# Patient Record
Sex: Male | Born: 1960 | Race: White | Hispanic: No | Marital: Married | Smoking: Current every day smoker
Health system: Southern US, Community
[De-identification: ages and names within clinical notes are randomized; demographics above are authoritative.]

## PROBLEM LIST (undated history)

## (undated) DIAGNOSIS — F419 Anxiety disorder, unspecified: Secondary | ICD-10-CM

## (undated) DIAGNOSIS — G4730 Sleep apnea, unspecified: Secondary | ICD-10-CM

## (undated) DIAGNOSIS — I10 Essential (primary) hypertension: Secondary | ICD-10-CM

## (undated) HISTORY — PX: CORONARY ARTERY BYPASS GRAFT: SHX141

---

## 2007-02-11 ENCOUNTER — Emergency Department (HOSPITAL_COMMUNITY): Admission: EM | Admit: 2007-02-11 | Discharge: 2007-02-11 | Payer: Self-pay | Admitting: Emergency Medicine

## 2011-03-20 ENCOUNTER — Encounter: Payer: Self-pay | Admitting: *Deleted

## 2011-03-20 ENCOUNTER — Emergency Department (INDEPENDENT_AMBULATORY_CARE_PROVIDER_SITE_OTHER): Payer: 59

## 2011-03-20 ENCOUNTER — Emergency Department (HOSPITAL_BASED_OUTPATIENT_CLINIC_OR_DEPARTMENT_OTHER)
Admission: EM | Admit: 2011-03-20 | Discharge: 2011-03-20 | Disposition: A | Discharge status: Home / Self Care | Payer: 59 | Attending: Emergency Medicine | Admitting: Emergency Medicine

## 2011-03-20 DIAGNOSIS — F411 Generalized anxiety disorder: Secondary | ICD-10-CM | POA: Insufficient documentation

## 2011-03-20 DIAGNOSIS — M2240 Chondromalacia patellae, unspecified knee: Secondary | ICD-10-CM

## 2011-03-20 DIAGNOSIS — I10 Essential (primary) hypertension: Secondary | ICD-10-CM | POA: Insufficient documentation

## 2011-03-20 DIAGNOSIS — M79606 Pain in leg, unspecified: Secondary | ICD-10-CM

## 2011-03-20 DIAGNOSIS — G4730 Sleep apnea, unspecified: Secondary | ICD-10-CM | POA: Insufficient documentation

## 2011-03-20 DIAGNOSIS — M79609 Pain in unspecified limb: Secondary | ICD-10-CM | POA: Insufficient documentation

## 2011-03-20 DIAGNOSIS — F17200 Nicotine dependence, unspecified, uncomplicated: Secondary | ICD-10-CM | POA: Insufficient documentation

## 2011-03-20 DIAGNOSIS — M1120 Other chondrocalcinosis, unspecified site: Secondary | ICD-10-CM

## 2011-03-20 HISTORY — DX: Anxiety disorder, unspecified: F41.9

## 2011-03-20 HISTORY — DX: Essential (primary) hypertension: I10

## 2011-03-20 HISTORY — DX: Sleep apnea, unspecified: G47.30

## 2011-03-20 MED ORDER — ACETAMINOPHEN 325 MG PO TABS
650.0000 mg | ORAL_TABLET | Freq: Once | ORAL | Status: DC
  Filled 2011-03-20: qty 2

## 2011-03-20 MED ORDER — CYCLOBENZAPRINE HCL 10 MG PO TABS: 10.0000 mg | ORAL_TABLET | Freq: Two times a day (BID) | ORAL | Status: AC | PRN

## 2011-03-20 MED ORDER — KETOROLAC TROMETHAMINE 60 MG/2ML IM SOLN
60.0000 mg | Freq: Once | INTRAMUSCULAR | Status: DC
  Filled 2011-03-20: qty 2

## 2011-03-20 NOTE — ED Notes (Signed)
Pt c/o bil thigh/leg pain x 1 year .

## 2011-03-20 NOTE — ED Provider Notes (Addendum)
History     CSN: [id number] Arrival date & time: 03/20/2011  1:02 PM   First MD Initiated Contact with Patient 03/20/11 1326      Chief Complaint  Patient presents with  . Leg Pain    (Consider location/radiation/quality/duration/timing/severity/associated sxs/prior treatment) HPI Legs tingling for at least a year.  Now more tingling for with standing and working for six months .  Saw Dr. At [place] and had back x-rayed for same last year.  Now increased pain in right thigh for six months.  Seen at [facility] couple months ago for the same thing. Seen about two months ago at [facility] for same and given back exercises without relief.    Past Medical History  Diagnosis Date  . Hypertension   . Anxiety   . Sleep apnea    reviewed Past Surgical History  Procedure Date  . Coronary artery bypass graft   vsd repaird  History reviewed. No pertinent family history.  History  Substance Use Topics  . Smoking status: Current Everyday Smoker -- 0.5 packs/day  . Smokeless tobacco: Not on file  . Alcohol Use: No      Review of Systems  All other systems reviewed and are negative.    Allergies  Review of patient's allergies indicates no known allergies.  Home Medications   Current Outpatient Rx  Name Route Sig Dispense Refill  . BUDESONIDE-FORMOTEROL FUMARATE 160-4.5 MCG/ACT IN AERO Inhalation Inhale 2 puffs into the lungs 2 (two) times daily.      Marland Kitchen CARVEDILOL 6.25 MG PO TABS Oral Take 6.25 mg by mouth 2 (two) times daily with a meal.      . CLONAZEPAM 1 MG PO TABS Oral Take 1 mg by mouth 2 (two) times daily as needed.      Marland Kitchen FLUTICASONE PROPIONATE 50 MCG/ACT NA SUSP Nasal Place 2 sprays into the nose daily.        BP 140/88  Pulse 58  Temp(Src) 98.1 F (36.7 C) (Oral)  Ht 5\' 11"  (1.803 m)  Wt 260 lb (117.935 kg)  BMI 36.26 kg/m2  SpO2 99%  Physical Exam  Vitals reviewed. Constitutional: He is oriented to person, place, and time. He appears well-developed  and well-nourished.  HENT:  Head: Normocephalic and atraumatic.  Eyes: Conjunctivae and EOM are normal. Pupils are equal, round, and reactive to light.  Neck: Normal range of motion. Neck supple.  Cardiovascular: Normal rate and regular rhythm.   Pulmonary/Chest: Effort normal and breath sounds normal.  Abdominal: Soft. Bowel sounds are normal.  Musculoskeletal: Normal range of motion. He exhibits no tenderness.       ls spine normal without tenderness or paraspinal tenderness full arom  Neurological: He is alert and oriented to person, place, and time. He has normal reflexes.  Skin: Skin is warm and dry.  Psychiatric: He has a normal mood and affect.    ED Course  Procedures (including critical care time)  Labs Reviewed - No data to display No results found.   No diagnosis found.    MDM   Dg Femur Right  03/20/2011  *RADIOLOGY REPORT*  Clinical Data: Right lateral femur pain.  No injury.  Bilateral knee fractures as a child.  RIGHT FEMUR - 2 VIEW  Comparison: None.  Findings: The right femur is intact.  There is radiolucency in the central patella, compatible with chondromalacia patella based on the lateral view.  The chondrocalcinosis of the medial and lateral meniscus is noted, probably post-traumatic.  The  proximal femur appears within normal limits.  Visualized pelvis is unremarkable. The soft tissues of the thigh are within normal limits.  IMPRESSION:  1.  No acute osseous abnormality. 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  Original Report Authenticated By: Andreas Newport, M.D.        Hilario Quarry, MD 03/20/11 0454  Hilario Quarry, MD 03/20/11 1512  Hilario Quarry, MD 03/21/11 [phone]

## 2013-04-16 ENCOUNTER — Emergency Department (HOSPITAL_BASED_OUTPATIENT_CLINIC_OR_DEPARTMENT_OTHER): Payer: Self-pay

## 2013-04-16 ENCOUNTER — Encounter (HOSPITAL_BASED_OUTPATIENT_CLINIC_OR_DEPARTMENT_OTHER): Payer: Self-pay | Admitting: Emergency Medicine

## 2013-04-16 ENCOUNTER — Emergency Department (HOSPITAL_BASED_OUTPATIENT_CLINIC_OR_DEPARTMENT_OTHER)
Admission: EM | Admit: 2013-04-16 | Discharge: 2013-04-16 | Disposition: A | Discharge status: Home / Self Care | Payer: Self-pay | Attending: Emergency Medicine | Admitting: Emergency Medicine

## 2013-04-16 DIAGNOSIS — IMO0002 Reserved for concepts with insufficient information to code with codable children: Secondary | ICD-10-CM | POA: Insufficient documentation

## 2013-04-16 DIAGNOSIS — F411 Generalized anxiety disorder: Secondary | ICD-10-CM | POA: Insufficient documentation

## 2013-04-16 DIAGNOSIS — R51 Headache: Secondary | ICD-10-CM | POA: Insufficient documentation

## 2013-04-16 DIAGNOSIS — Z951 Presence of aortocoronary bypass graft: Secondary | ICD-10-CM | POA: Insufficient documentation

## 2013-04-16 DIAGNOSIS — F17200 Nicotine dependence, unspecified, uncomplicated: Secondary | ICD-10-CM | POA: Insufficient documentation

## 2013-04-16 DIAGNOSIS — J209 Acute bronchitis, unspecified: Secondary | ICD-10-CM | POA: Insufficient documentation

## 2013-04-16 DIAGNOSIS — J40 Bronchitis, not specified as acute or chronic: Secondary | ICD-10-CM

## 2013-04-16 DIAGNOSIS — Z79899 Other long term (current) drug therapy: Secondary | ICD-10-CM | POA: Insufficient documentation

## 2013-04-16 DIAGNOSIS — I10 Essential (primary) hypertension: Secondary | ICD-10-CM | POA: Insufficient documentation

## 2013-04-16 LAB — CBC WITH DIFFERENTIAL/PLATELET
HCT: 41.7 % (ref 39.0–52.0)
Hemoglobin: 14.2 g/dL (ref 13.0–17.0)
Lymphs Abs: 2.6 10*3/uL (ref 0.7–4.0)
Monocytes Absolute: 0.4 10*3/uL (ref 0.1–1.0)
Monocytes Relative: 7 % (ref 3–12)
Neutro Abs: 3 10*3/uL (ref 1.7–7.7)
Neutrophils Relative %: 49 % (ref 43–77)
RBC: 4.52 MIL/uL (ref 4.22–5.81)

## 2013-04-16 LAB — BASIC METABOLIC PANEL
BUN: 19 mg/dL (ref 6–23)
CO2: 24 mEq/L (ref 19–32)
Chloride: 99 mEq/L (ref 96–112)
Creatinine, Ser: 1.1 mg/dL (ref 0.50–1.35)
Glucose, Bld: 96 mg/dL (ref 70–99)
Potassium: 3.9 mEq/L (ref 3.5–5.1)

## 2013-04-16 LAB — TROPONIN I: Troponin I: <0.3 ng/mL (ref <0.30)

## 2013-04-16 MED ORDER — HYDROCOD POLST-CHLORPHEN POLST 10-8 MG/5ML PO LQCR: 5.0000 mL | Freq: Two times a day (BID) | ORAL | Status: AC | PRN

## 2013-04-16 MED ORDER — ALBUTEROL SULFATE (5 MG/ML) 0.5% IN NEBU
5.0000 mg | INHALATION_SOLUTION | Freq: Once | RESPIRATORY_TRACT | Status: AC
  Administered 2013-04-16: 5 mg via RESPIRATORY_TRACT
  Filled 2013-04-16: qty 1

## 2013-04-16 MED ORDER — AZITHROMYCIN 250 MG PO TABS: 250.0000 mg | ORAL_TABLET | Freq: Every day | ORAL | Status: AC

## 2013-04-16 MED ORDER — IPRATROPIUM BROMIDE 0.02 % IN SOLN
0.5000 mg | Freq: Once | RESPIRATORY_TRACT | Status: AC
  Administered 2013-04-16: 0.5 mg via RESPIRATORY_TRACT
  Filled 2013-04-16: qty 2.5

## 2013-04-16 NOTE — ED Notes (Signed)
NP at bedside.

## 2013-04-16 NOTE — ED Provider Notes (Signed)
CSN: [id number]     Arrival date & time 04/16/13  1718 History   First MD Initiated Contact with Patient 04/16/13 1730     Chief Complaint  Patient presents with  . Cough  . Headache  . Chest Pain    with cough for "a while".   (Consider location/radiation/quality/duration/timing/severity/associated sxs/prior Treatment) HPI Comments: Pt c/o intermittent cp with cough and headache:subjective fever  Patient is a 52 y.o. male presenting with cough. The history is provided by the patient. No language interpreter was used.  Cough Cough characteristics:  Productive Sputum characteristics:  White Severity:  Moderate Onset quality:  Gradual Duration:  1 week Timing:  Constant Relieved by:  Nothing Worsened by:  Nothing tried Ineffective treatments:  None tried   Past Medical History  Diagnosis Date  . Hypertension   . Anxiety   . Sleep apnea    Past Surgical History  Procedure Laterality Date  . Coronary artery bypass graft     No family history on file. History  Substance Use Topics  . Smoking status: Current Every Day Smoker -- 0.50 packs/day    Types: Cigarettes  . Smokeless tobacco: Not on file  . Alcohol Use: No    Review of Systems  Constitutional: Negative.   Respiratory: Positive for cough.   Cardiovascular: Negative.     Allergies  Indocin  Home Medications   Current Outpatient Rx  Name  Route  Sig  Dispense  Refill  . budesonide-formoterol (SYMBICORT) 160-4.5 MCG/ACT inhaler   Inhalation   Inhale 2 puffs into the lungs 2 (two) times daily.           . carvedilol (COREG) 6.25 MG tablet   Oral   Take 6.25 mg by mouth 2 (two) times daily with a meal.           . clonazePAM (KLONOPIN) 1 MG tablet   Oral   Take 1 mg by mouth 2 (two) times daily as needed.           . fluticasone (FLONASE) 50 MCG/ACT nasal spray   Nasal   Place 2 sprays into the nose daily.            BP 150/81  Pulse 67  Temp(Src) 97.7 F (36.5 C) (Oral)  Resp 18   Ht 5\' 11"  (1.803 m)  Wt 273 lb (123.832 kg)  BMI 38.09 kg/m2  SpO2 100% Physical Exam  Vitals reviewed. Constitutional: He is oriented to person, place, and time. He appears well-developed and well-nourished.  HENT:  Head: Normocephalic.  Right Ear: External ear normal.  Left Ear: External ear normal.  Mouth/Throat: Posterior oropharyngeal erythema present.  Eyes: Conjunctivae and EOM are normal.  Cardiovascular: Normal rate and regular rhythm.   Pulmonary/Chest: No respiratory distress. He has wheezes.  Abdominal: Soft. Bowel sounds are normal.  Musculoskeletal: Normal range of motion. He exhibits no edema.  Neurological: He is alert and oriented to person, place, and time.  Skin: Skin is warm and dry.    ED Course  Procedures (including critical care time) Labs Review Labs Reviewed  BASIC METABOLIC PANEL - Abnormal; Notable for the following:    GFR calc non Af Amer 75 (*)    GFR calc Af Amer 87 (*)    All other components within normal limits  TROPONIN I  CBC WITH DIFFERENTIAL   Imaging Review Dg Chest 2 View  04/16/2013   CLINICAL DATA:  Cough, headache and chest pain.  EXAM: CHEST  2 VIEW  COMPARISON:  11/22/2010.  FINDINGS: The cardiac silhouette, mediastinal and hilar contours are within normal limits and stable. The lungs are clear. No pleural effusion. The bony thorax is intact.  IMPRESSION: No acute cardiopulmonary findings.   Electronically Signed   By: Loralie Champagne M.D.   On: 04/16/2013 18:12    EKG Interpretation    Date/Time:  Thursday April 16 2013 17:32:32 EST Ventricular Rate:  72 PR Interval:  184 QRS Duration: 98 QT Interval:  368 QTC Calculation: 402 R Axis:   51 Text Interpretation:  Normal sinus rhythm Nonspecific T wave abnormality Abnormal ECG diffuse T wave flattening.  No significant change was found Confirmed by Manus Gunning  MD, STEPHEN ([id number]) on 04/16/2013 5:37:02 PM            MDM   1. Bronchitis    Will treat with z-pack and  tussionex:no pneumonia noted on x-ray:doubt cp is cardiac in nature:negative tropinin    Teressa Lower, NP 04/16/13 1909

## 2013-04-16 NOTE — ED Notes (Signed)
Cough, headache and chest pain for a week. He has been seen by his MD and told he has a pulled muscle.

## 2013-04-16 NOTE — ED Notes (Signed)
Patient transported to X-ray 

## 2013-04-17 NOTE — ED Provider Notes (Signed)
Medical screening examination/treatment/procedure(s) were performed by non-physician practitioner and as supervising physician I was immediately available for consultation/collaboration.  EKG Interpretation    Date/Time:  Thursday April 16 2013 17:32:32 EST Ventricular Rate:  72 PR Interval:  184 QRS Duration: 98 QT Interval:  368 QTC Calculation: 402 R Axis:   51 Text Interpretation:  Normal sinus rhythm Nonspecific T wave abnormality Abnormal ECG diffuse T wave flattening.  No significant change was found Confirmed by Manus Gunning  MD, Sou Nohr ([id number]) on 04/16/2013 5:37:02 PM             Glynn Octave, MD 04/17/13 [phone]

## 2014-03-01 ENCOUNTER — Encounter (HOSPITAL_BASED_OUTPATIENT_CLINIC_OR_DEPARTMENT_OTHER): Payer: Self-pay | Admitting: Emergency Medicine

## 2014-03-01 ENCOUNTER — Emergency Department (HOSPITAL_BASED_OUTPATIENT_CLINIC_OR_DEPARTMENT_OTHER): Payer: BC Managed Care – PPO

## 2014-03-01 ENCOUNTER — Emergency Department (HOSPITAL_BASED_OUTPATIENT_CLINIC_OR_DEPARTMENT_OTHER)
Admission: EM | Admit: 2014-03-01 | Discharge: 2014-03-02 | Disposition: A | Discharge status: Home / Self Care | Payer: BC Managed Care – PPO | Attending: Emergency Medicine | Admitting: Emergency Medicine

## 2014-03-01 DIAGNOSIS — Z720 Tobacco use: Secondary | ICD-10-CM | POA: Diagnosis not present

## 2014-03-01 DIAGNOSIS — R1110 Vomiting, unspecified: Secondary | ICD-10-CM | POA: Diagnosis not present

## 2014-03-01 DIAGNOSIS — R0789 Other chest pain: Secondary | ICD-10-CM | POA: Diagnosis not present

## 2014-03-01 DIAGNOSIS — Z792 Long term (current) use of antibiotics: Secondary | ICD-10-CM | POA: Insufficient documentation

## 2014-03-01 DIAGNOSIS — F419 Anxiety disorder, unspecified: Secondary | ICD-10-CM | POA: Insufficient documentation

## 2014-03-01 DIAGNOSIS — Z7951 Long term (current) use of inhaled steroids: Secondary | ICD-10-CM | POA: Insufficient documentation

## 2014-03-01 DIAGNOSIS — IMO0001 Reserved for inherently not codable concepts without codable children: Secondary | ICD-10-CM

## 2014-03-01 DIAGNOSIS — R197 Diarrhea, unspecified: Secondary | ICD-10-CM | POA: Diagnosis not present

## 2014-03-01 DIAGNOSIS — R109 Unspecified abdominal pain: Secondary | ICD-10-CM | POA: Insufficient documentation

## 2014-03-01 DIAGNOSIS — I10 Essential (primary) hypertension: Secondary | ICD-10-CM | POA: Diagnosis not present

## 2014-03-01 DIAGNOSIS — R141 Gas pain: Secondary | ICD-10-CM | POA: Insufficient documentation

## 2014-03-01 DIAGNOSIS — Z951 Presence of aortocoronary bypass graft: Secondary | ICD-10-CM | POA: Insufficient documentation

## 2014-03-01 DIAGNOSIS — Z79899 Other long term (current) drug therapy: Secondary | ICD-10-CM | POA: Diagnosis not present

## 2014-03-01 DIAGNOSIS — M791 Myalgia: Secondary | ICD-10-CM | POA: Insufficient documentation

## 2014-03-01 DIAGNOSIS — R079 Chest pain, unspecified: Secondary | ICD-10-CM | POA: Diagnosis present

## 2014-03-01 LAB — URINALYSIS, ROUTINE W REFLEX MICROSCOPIC
Bilirubin Urine: NEGATIVE
Glucose, UA: NEGATIVE mg/dL
Hgb urine dipstick: NEGATIVE
KETONES UR: NEGATIVE mg/dL
LEUKOCYTES UA: NEGATIVE
Nitrite: NEGATIVE
PROTEIN: NEGATIVE mg/dL
Specific Gravity, Urine: 1.006 (ref 1.005–1.030)
UROBILINOGEN UA: 0.2 mg/dL (ref 0.0–1.0)
pH: 5.5 (ref 5.0–8.0)

## 2014-03-01 LAB — CBC WITH DIFFERENTIAL/PLATELET
BASOS ABS: 0 10*3/uL (ref 0.0–0.1)
BASOS PCT: 0 % (ref 0–1)
EOS PCT: 1 % (ref 0–5)
Eosinophils Absolute: 0.1 10*3/uL (ref 0.0–0.7)
HCT: 43.6 % (ref 39.0–52.0)
Hemoglobin: 14.6 g/dL (ref 13.0–17.0)
LYMPHS PCT: 50 % — AB (ref 12–46)
Lymphs Abs: 3.1 10*3/uL (ref 0.7–4.0)
MCH: 30.9 pg (ref 26.0–34.0)
MCHC: 33.5 g/dL (ref 30.0–36.0)
MCV: 92.2 fL (ref 78.0–100.0)
Monocytes Absolute: 0.4 10*3/uL (ref 0.1–1.0)
Monocytes Relative: 6 % (ref 3–12)
NEUTROS ABS: 2.7 10*3/uL (ref 1.7–7.7)
Neutrophils Relative %: 43 % (ref 43–77)
PLATELETS: 147 10*3/uL — AB (ref 150–400)
RBC: 4.73 MIL/uL (ref 4.22–5.81)
RDW: 13.7 % (ref 11.5–15.5)
WBC: 6.3 10*3/uL (ref 4.0–10.5)

## 2014-03-01 LAB — COMPREHENSIVE METABOLIC PANEL
ALBUMIN: 3.7 g/dL (ref 3.5–5.2)
ALT: 13 U/L (ref 0–53)
AST: 14 U/L (ref 0–37)
Alkaline Phosphatase: 65 U/L (ref 39–117)
Anion gap: 14 (ref 5–15)
BUN: 20 mg/dL (ref 6–23)
CALCIUM: 8.8 mg/dL (ref 8.4–10.5)
CO2: 23 meq/L (ref 19–32)
CREATININE: 1 mg/dL (ref 0.50–1.35)
Chloride: 102 mEq/L (ref 96–112)
GFR calc Af Amer: >90 mL/min (ref >90)
GFR, EST NON AFRICAN AMERICAN: 84 mL/min — AB (ref >90)
Glucose, Bld: 101 mg/dL — ABNORMAL HIGH (ref 70–99)
Potassium: 3.9 mEq/L (ref 3.7–5.3)
SODIUM: 139 meq/L (ref 137–147)
Total Bilirubin: 0.5 mg/dL (ref 0.3–1.2)
Total Protein: 7 g/dL (ref 6.0–8.3)

## 2014-03-01 LAB — TROPONIN I

## 2014-03-01 MED ORDER — DICYCLOMINE HCL 10 MG/ML IM SOLN
20.0000 mg | Freq: Once | INTRAMUSCULAR | Status: AC
  Administered 2014-03-01: 20 mg via INTRAMUSCULAR
  Filled 2014-03-01: qty 2

## 2014-03-01 MED ORDER — GI COCKTAIL ~~LOC~~
30.0000 mL | Freq: Once | ORAL | Status: AC
  Administered 2014-03-01: 30 mL via ORAL
  Filled 2014-03-01: qty 30

## 2014-03-01 MED ORDER — ONDANSETRON 4 MG PO TBDP
4.0000 mg | ORAL_TABLET | Freq: Once | ORAL | Status: AC
  Administered 2014-03-01: 4 mg via ORAL
  Filled 2014-03-01: qty 1

## 2014-03-01 NOTE — ED Notes (Signed)
Patient transported to X-ray 

## 2014-03-01 NOTE — ED Notes (Addendum)
Pt c/o left sided chest pain  With  abd pain n/v/d x 2 days

## 2014-03-01 NOTE — ED Provider Notes (Signed)
CSN: [id number]     Arrival date & time 03/01/14  1920 History  This chart was scribed for Mason Mendoza Smitty Cords, MD by Richarda Overlie, ED Scribe. This patient was seen in room MH10/MH10 and the patient's care was started 11:20 PM.     Chief Complaint  Patient presents with  . Chest Pain    Patient is a 53 y.o. male presenting with chest pain. The history is provided by the patient. No language interpreter was used.  Chest Pain Pain location:  L lateral chest Pain quality: aching   Pain radiates to:  Does not radiate Pain radiates to the back: no   Pain severity:  Moderate Onset quality:  Gradual Duration:  24 months Timing:  Constant Progression:  Unchanged Chronicity:  Chronic Context: no drug use   Relieved by:  Nothing Worsened by:  Nothing tried Ineffective treatments:  None tried Associated symptoms: abdominal pain and vomiting   Associated symptoms: no cough, no fever, no palpitations and no shortness of breath   Associated symptoms comment:  Diarrhea Abdominal pain:    Location:  Generalized   Quality:  Aching and cramping   Severity:  Severe   Onset quality:  Gradual   Timing:  Constant   Progression:  Unchanged   Chronicity:  Chronic Risk factors: male sex    HPI Comments: RAND ETCHISON is a 53 y.o. male with constant left lateral (under the arm) chest pain.  He reports that he has had the left sided pain for the last 2 years and the abdominal pain the last 3 months. He states that his pain has gradually worsened in the last 2 days. The patient reports associated nausea, vomiting and diarrhea. The patient reports that Prilosec has failed to relieve his symptoms. Has been told it is chest wall pain and GERD by his PMD      Past Medical History  Diagnosis Date  . Hypertension   . Anxiety   . Sleep apnea    Past Surgical History  Procedure Laterality Date  . Coronary artery bypass graft     No family history on file. History  Substance Use Topics  .  Smoking status: Current Every Day Smoker -- 0.50 packs/day    Types: Cigarettes  . Smokeless tobacco: Not on file  . Alcohol Use: No    Review of Systems  Constitutional: Negative for fever, chills, activity change, appetite change and unexpected weight change.  Respiratory: Negative for cough and shortness of breath.   Cardiovascular: Positive for chest pain. Negative for palpitations and leg swelling.  Gastrointestinal: Positive for vomiting, abdominal pain and diarrhea.  Genitourinary: Negative for dysuria.  Musculoskeletal: Positive for myalgias.  All other systems reviewed and are negative.     Allergies  Indocin  Home Medications   Prior to Admission medications   Medication Sig Start Date End Date Taking? Authorizing Provider  azithromycin (ZITHROMAX) 250 MG tablet Take 1 tablet (250 mg total) by mouth daily. Take first 2 tablets together, then 1 every day until finished. 04/16/13   Teressa Lower, NP  budesonide-formoterol (SYMBICORT) 160-4.5 MCG/ACT inhaler Inhale 2 puffs into the lungs 2 (two) times daily.      Historical Provider, MD  carvedilol (COREG) 6.25 MG tablet Take 6.25 mg by mouth 2 (two) times daily with a meal.      Historical Provider, MD  chlorpheniramine-HYDROcodone (TUSSIONEX PENNKINETIC ER) 10-8 MG/5ML LQCR Take 5 mLs by mouth every 12 (twelve) hours as needed for cough. 04/16/13  Teressa LowerVrinda Pickering, NP  clonazePAM (KLONOPIN) 1 MG tablet Take 1 mg by mouth 2 (two) times daily as needed.      Historical Provider, MD  fluticasone (FLONASE) 50 MCG/ACT nasal spray Place 2 sprays into the nose daily.      Historical Provider, MD   BP 172/81  Pulse 64  Temp(Src) 98.7 F (37.1 C) (Oral)  Wt 270 lb (122.471 kg)  SpO2 99% Physical Exam  Nursing note and vitals reviewed. Constitutional: He is oriented to person, place, and time. He appears well-developed and well-nourished. No distress.  HENT:  Head: Normocephalic and atraumatic.  Mouth/Throat: Oropharynx  is clear and moist.  Moist mucous membranes  Eyes: EOM are normal. Pupils are equal, round, and reactive to light.  Neck: Normal range of motion. Neck supple.  Cardiovascular: Normal rate, normal heart sounds and intact distal pulses.   Pulmonary/Chest: Effort normal and breath sounds normal. No stridor. No respiratory distress. He has no decreased breath sounds. He has no wheezes. He has no rales.   He exhibits tenderness.  Abdominal: Soft. He exhibits no distension and no mass. There is no tenderness. There is no rebound and no guarding.  Hyperactive bowel sounds   Musculoskeletal: Normal range of motion. He exhibits no edema and no tenderness.  Lymphadenopathy:    He has no cervical adenopathy.  Neurological: He is alert and oriented to person, place, and time. He has normal reflexes.  Skin: Skin is warm and dry. He is not diaphoretic.  Psychiatric: He has a normal mood and affect.    ED Course  Procedures  DIAGNOSTIC STUDIES: Oxygen Saturation is 99% on RA, normal by my interpretation.    COORDINATION OF CARE: 11:28 PM Discussed treatment plan with pt at bedside and pt agreed to plan.   Labs Review Labs Reviewed  CBC WITH DIFFERENTIAL - Abnormal; Notable for the following:    Platelets 147 (*)    Lymphocytes Relative 50 (*)    All other components within normal limits  URINALYSIS, ROUTINE W REFLEX MICROSCOPIC  COMPREHENSIVE METABOLIC PANEL  TROPONIN I    Imaging Review No results found.   EKG Interpretation   Date/Time:  Monday March 01 2014 19:27:47 EDT Ventricular Rate:  66 PR Interval:  192 QRS Duration: 102 QT Interval:  398 QTC Calculation: 417 R Axis:   57 Text Interpretation:  Normal sinus rhythm Normal ECG Sinus rhythm  Non-specific intra-ventricular conduction delay T wave abnormality  Abnormal ekg Confirmed by Gerhard MunchLOCKWOOD, ROBERT  MD ([id number]) on 03/01/2014  10:18:49 PM      MDM   Final diagnoses:  None  Exam and vitals benign and reassuring.   Given the number of complaints and duration or symptoms I highly doubt that any emergency condition exists.  Patient has seen several doctors for same.    In the setting of negative EKG and troponin and > 2 years duration ACS is excluded with treat for pain and strain.  Have added carafate until Patient can see his PMD and restart his GERD meds that he does not know the name of.   Follow up with your doctor for recheck.  Strict abdominal pain return precautions given    I personally performed the services described in this documentation, which was scribed in my presence. The recorded information has been reviewed and is accurate.      Jasmine AweApril K Lenzi Marmo-Rasch, MD 03/02/14 [phone]

## 2014-03-02 ENCOUNTER — Encounter (HOSPITAL_BASED_OUTPATIENT_CLINIC_OR_DEPARTMENT_OTHER): Payer: Self-pay | Admitting: Emergency Medicine

## 2014-03-02 MED ORDER — METHOCARBAMOL 500 MG PO TABS
ORAL_TABLET | ORAL | Status: AC
  Administered 2014-03-02: 1000 mg via ORAL
  Filled 2014-03-02: qty 2

## 2014-03-02 MED ORDER — SUCRALFATE 1 GM/10ML PO SUSP: 1.0000 g | Freq: Three times a day (TID) | ORAL | Status: AC

## 2014-03-02 MED ORDER — METHOCARBAMOL 500 MG PO TABS
1000.0000 mg | ORAL_TABLET | Freq: Once | ORAL | Status: AC
  Administered 2014-03-02: 1000 mg via ORAL

## 2014-03-02 MED ORDER — TRAMADOL HCL 50 MG PO TABS: 50.0000 mg | ORAL_TABLET | Freq: Four times a day (QID) | ORAL | Status: AC | PRN

## 2014-03-02 MED ORDER — METHOCARBAMOL 1000 MG/10ML IJ SOLN
1000.0000 mg | Freq: Once | INTRAMUSCULAR | Status: DC
  Filled 2014-03-02: qty 10

## 2014-03-02 MED ORDER — METHOCARBAMOL 500 MG PO TABS: 500.0000 mg | ORAL_TABLET | Freq: Two times a day (BID) | ORAL | Status: AC

## 2015-03-01 IMAGING — CR DG CHEST 2V
2 series · 2 of 2 positions shown · non-contrast
Comparison: Chest radiograph performed 04/16/2013

CLINICAL DATA: Acute onset of left-sided chest pain for 2 days,
with nausea, vomiting and diarrhea. Initial encounter.

EXAM:
CHEST  2 VIEW

[w chest pa]
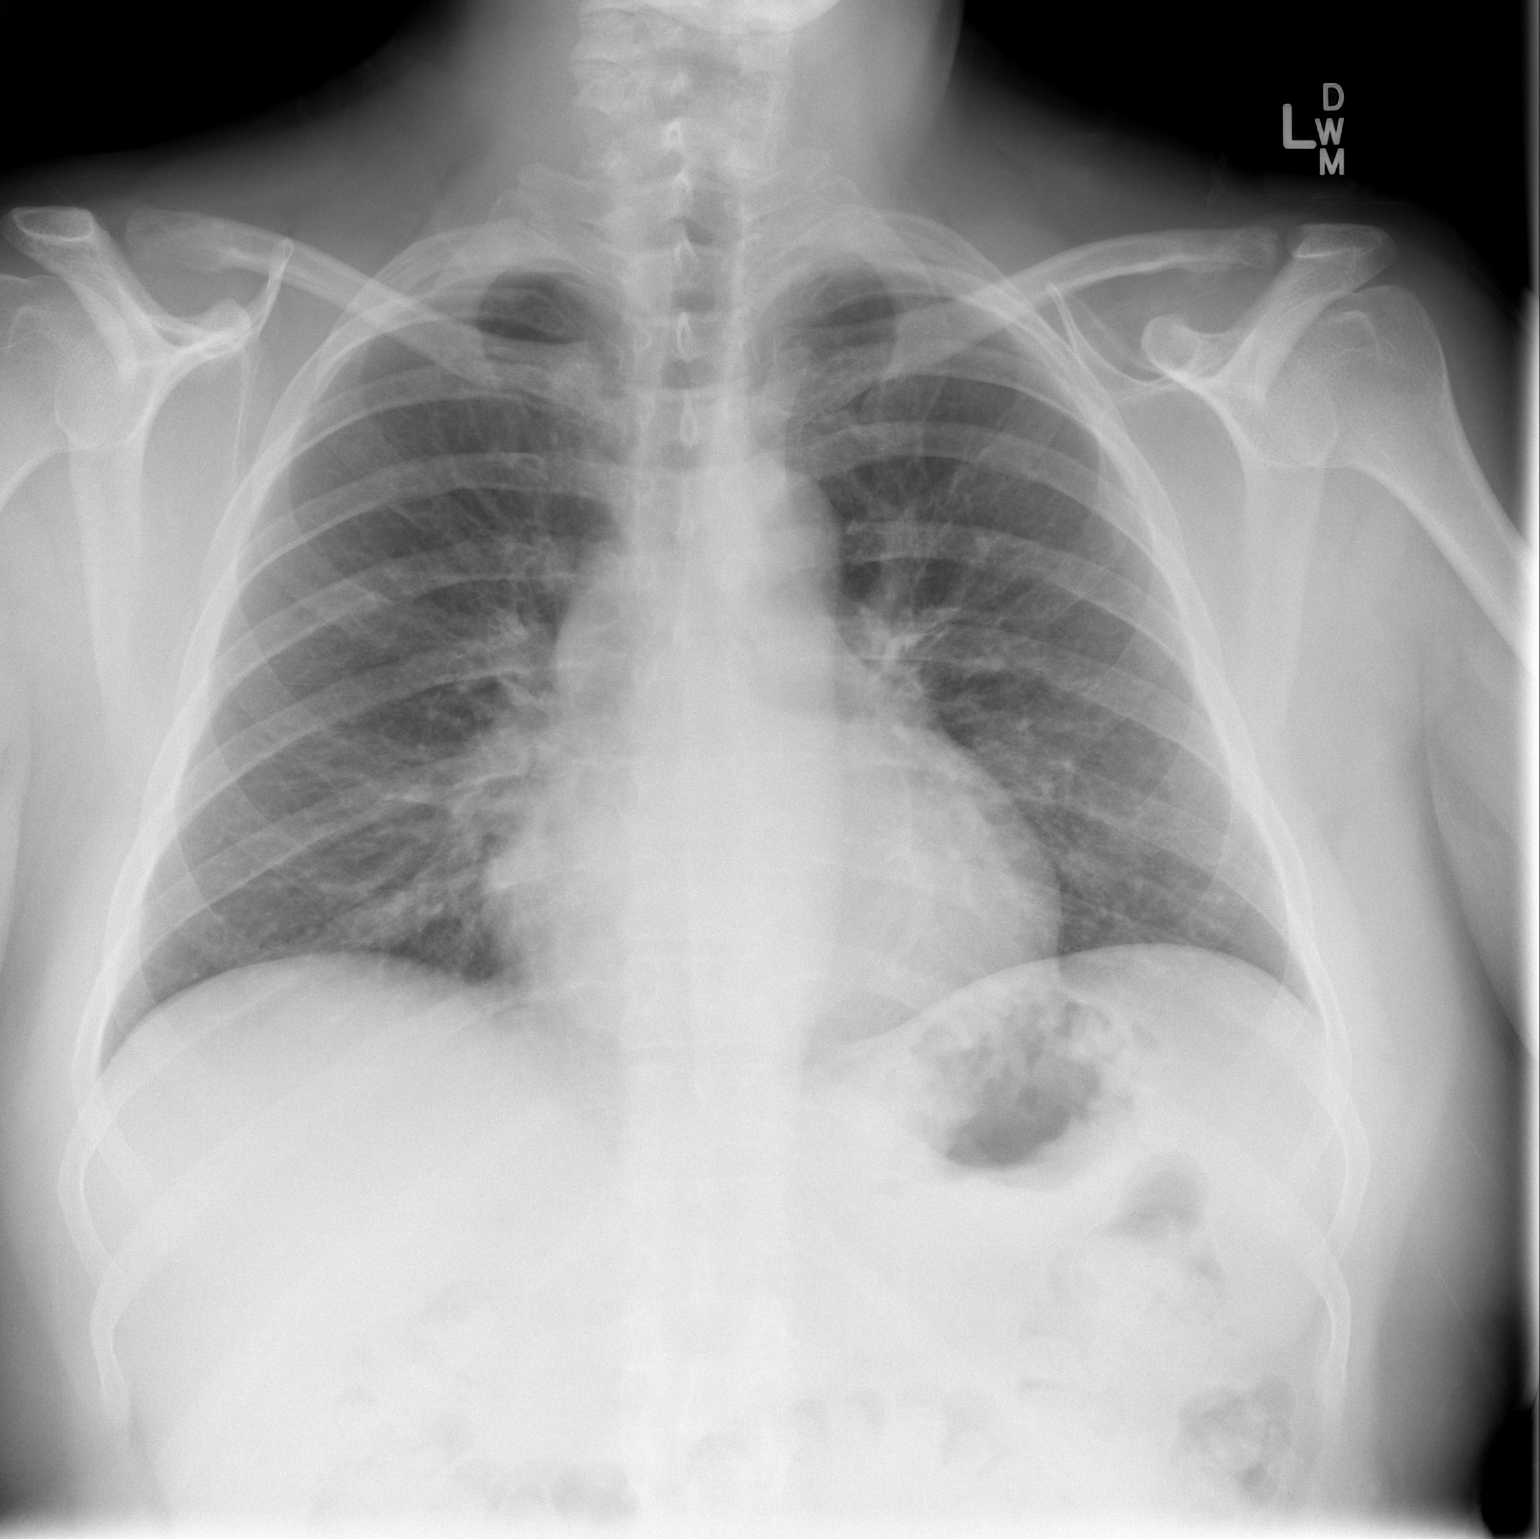

[w chest lat]
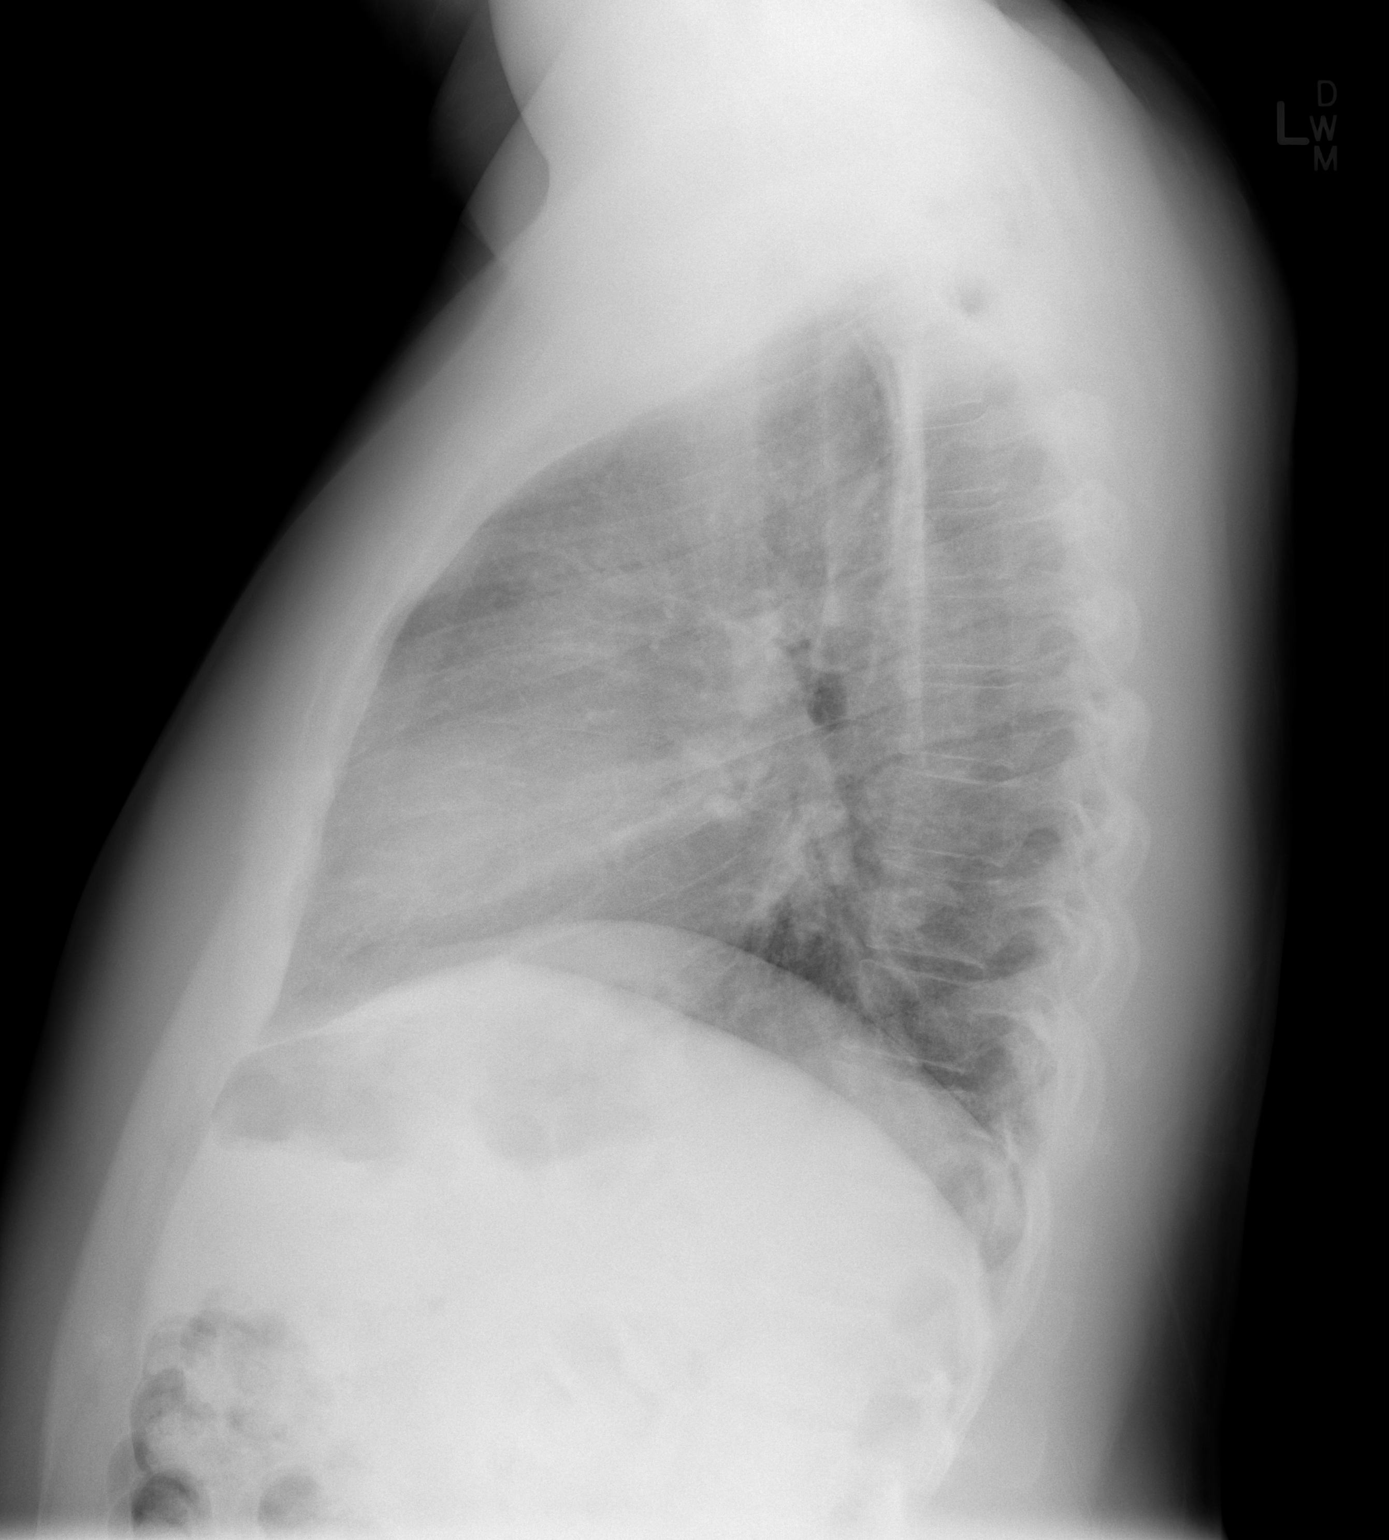

[2 of 2 positions shown; findings below may reference images not displayed]

FINDINGS: The lungs are well-aerated. Mild vascular congestion is noted. There
is no evidence of focal opacification, pleural effusion or
pneumothorax.

The heart is normal in size; the mediastinal contour is within
normal limits. No acute osseous abnormalities are seen.
IMPRESSION: Mild vascular congestion noted; lungs remain grossly clear.

## 2023-06-28 NOTE — Telephone Encounter (Signed)
..  Patient Care Advocacy - Medicare Wellness Visit Initiative  Today, I reached out to Mason Mendoza regarding his Medicare Wellness Visit.  [facility] Appointment Scheduled?  Yes  Date of [facility] Appointment:  (07/17/23 @ 9:45 AM)  Thanks, Emmie LITTIE Brewer, CMA 06/28/2023 1:55 PM

## 2023-07-10 NOTE — Telephone Encounter (Signed)
 [facility] needs new information in order to fill prescription for Clonazepam. Needs Diagnosis code and most recent office dates  # [phone]

## 2023-07-11 NOTE — Telephone Encounter (Signed)
 PC to [place] to provide requested information.

## 2023-10-17 NOTE — Telephone Encounter (Signed)
 Returned patient's voicemail to schedule an appointment for medication refills. No answer-left message to call back

## 2023-11-08 NOTE — Progress Notes (Signed)
 [facility] Patient Information   Location Information: Patient State (at time of visit): [place]  Patient Location (at time of visit):Home/Other Non-Medical  Provider Location: [place]  Is provider licensed to provide clinical care in the current location/state of the patient? Yes  Consent   Patient's identity was confirmed. Presenting condition or illness was discussed with the patient/personal representative. Current proposed treatment for presenting condition or illness was explained to patient/personal representative along with the likely benefits and any significant risks or complications associated with the provision of treatment by audio/video means. The patient/personal representative verbally authorized treatment to be provided by audio/video, which may include a limited review of patient's current health status, medication, or other treatment recommendations, patient education, and an opportunity to ask questions about condition and treatment. Verbal Consent Granted by Patient/Personal Representative:Yes   Visit Information: Modality: 2-Way Real-Time Audio/Video  Video Start Time: 4:04 PM Video Stop Time: 4:16 PM  History of Present Illness  Chief complaint: Stomach pain and diarrhea last week for the whole week and started back last night    History of Present Illness The patient is a 63 year old male who presents via virtual visit for evaluation of diarrhea and stomach pains.  He has been experiencing persistent diarrhea for the past week, with a brief respite of 3 days before the symptoms resumed last night. The frequency of bowel movements was high last week, with 3 episodes today. The stools were watery in consistency last week and the first episode today was also watery. He reports no recent travel outside the [place]  or antibiotic use within the past 3 months. He felt he experienced dehydration last week but has been consuming  more food this week.  He reports abdominal pain localized to the right side between the RUQ and RLQ.  Pain is improved today, was worse last week.  Rates pain as mild. He is supposed to take omeprazole but does not take it because it exacerbates his diarrhea.    Review of Systems  Constitutional:  Negative for chills and fever.  Gastrointestinal:  Positive for abdominal pain and diarrhea.     Medications prior to encounter: Medications Ordered Prior to Encounter[1]  Video Exam      Physical Exam Constitutional:      General: He is not in acute distress.    Appearance: Normal appearance. He is not ill-appearing or toxic-appearing.  Pulmonary:     Effort: No respiratory distress.     Breath sounds: No wheezing.     Comments: Speaking in full clear sentences without signs of respiratory distress. No audible wheezes noted. Abdominal:     Comments: Currently on commode at time of virtual visit. Reports abdominal pain localized to the right mid abdomen, no pain specifically to the RUQ or RLQ on self palpation.   Neurological:     Mental Status: He is alert.     Diagnosis, Medical Decision Making & Disposition   Assessment/Plan  1. Diarrhea of presumed infectious origin (Primary) - Gastrointestinal Pathogen Profile, Stool, NAAT; Future - Fecal Leukocytes; Future - Ova and Parasite (O+P) Exam; Future - Clostridioides difficile Antigen and Toxin EIA; Future - Comprehensive Metabolic Panel; Future - CBC with Differential; Future - Lipase; Future    Assessment & Plan 1. Diarrhea. - Persistent diarrhea for the past week, with a brief respite of 3 days before symptoms resumed last night. Frequency was high last week, with 3 episodes today. - Stools were watery last week and the first episode  today was also watery. - No recent travel outside the [place]  or antibiotic use within the past 3 months. Experienced dehydration last week but has been consuming more food this  week. - Stool study will be conducted to identify potential bacterial, viral, or parasitic infections. Additional labs will be ordered to monitor potassium, sodium, and white blood cell count levels. Advised to maintain adequate hydration and consider using Imodium only if necessary. If dehydration occurs, an in-person visit is recommended. If blood is observed in the stool, an emergency room visit is advised.  2. Abdominal pain. - Reports abdominal pain localized to the right mid abdomen - No pain specifically to the RUQ or RLQ. - ER precautions discussed today.  Discussed signs and symptoms of gallbladder disorder and appendicitis.    Prescriptions from this Encounter   No orders of the defined types were placed in this encounter.    Disposition   If condition(s) worsens, or not improving Toshiro needs re-evaluation. Disposition: Patient to continue care at home       [1] Current Outpatient Medications on File Prior to Visit  Medication Sig Dispense Refill  . atorvastatin (LIPITOR) 20 mg tablet Take 1 tablet by mouth once daily 30 tablet 0  . benztropine (COGENTIN) 0.5 mg tablet Take 0.5 mg by mouth nightly.    . citalopram (CeleXA) 40 mg tablet Take 40 mg by mouth Once Daily.    . clonazePAM (KlonoPIN) 0.5 mg tablet TAKE 1 TABLET BY MOUTH THREE TIMES DAILY 270 tablet 3  . cyclobenzaprine  (FLEXERIL ) 10 mg tablet Take 1 tablet (10 mg total) by mouth 2 (two) times a day as needed for muscle spasms. 60 tablet 2  . fluticasone propionate (FLONASE) 50 mcg/spray nasal spray Use 2 spray(s) in each nostril once daily 16 g 0  . hydrocortisone 2.5 % cream Twice daily to scaly areas on forehead 28 g 2  . metoprolol succinate (TOPROL XL) 50 mg 24 hr tablet Take 1 tablet by mouth once daily 30 tablet 0  . Spiriva with HandiHaler 18 mcg per inhalation Inhale 1 puff by mouth once daily 90 capsule 0  . Symbicort 160-4.5 mcg/actuation inhaler Inhale 2 puffs by mouth twice daily 33 g 0  .  Ventolin  HFA 90 mcg/actuation inhaler Inhale 2 puffs every 4 (four) hours as needed (wheezing). 3 each 3  . zonisamide (ZONEGRAN) 100 mg capsule Take 3 capsules (300 mg total) by mouth every evening. 270 capsule 3  . lurasidone (Latuda) 60 mg tab tablet Take 60 mg by mouth every evening. (Patient not taking: Reported on 11/08/2023)    . magnesium chloride (Mag 64) 64 mg (magnesium) TbEC DR tablet Take 64 mg by mouth Once Daily. (Patient not taking: Reported on 11/08/2023)    . omeprazole (PriLOSEC) 40 mg DR capsule Take 1 capsule by mouth once daily (Patient not taking: Reported on 11/08/2023) 90 capsule 0  . pregabalin (LYRICA) 150 mg capsule Take 1 capsule by mouth twice daily (Patient not taking: Reported on 11/08/2023) 180 capsule 0  . sennosides-docusate sodium (PERICOLACE) 8.6-50 mg per tablet Take 2 tablets by mouth nightly. (Patient not taking: Reported on 11/08/2023) 60 tablet 2  . verapamil SR (CALAN-SR) 180 mg CR tablet Take 1 tablet (180 mg total) by mouth daily. (Patient not taking: Reported on 11/08/2023) 90 tablet 3   No current facility-administered medications on file prior to visit.

## 2023-12-09 ENCOUNTER — Ambulatory Visit (HOSPITAL_COMMUNITY)
Admission: EM | Admit: 2023-12-09 | Discharge: 2023-12-09 | Disposition: A | Discharge status: Home / Self Care | Attending: Nurse Practitioner | Admitting: Nurse Practitioner

## 2023-12-09 DIAGNOSIS — F32A Depression, unspecified: Secondary | ICD-10-CM | POA: Insufficient documentation

## 2023-12-09 DIAGNOSIS — F1410 Cocaine abuse, uncomplicated: Secondary | ICD-10-CM | POA: Diagnosis not present

## 2023-12-09 DIAGNOSIS — F1220 Cannabis dependence, uncomplicated: Secondary | ICD-10-CM | POA: Diagnosis not present

## 2023-12-09 NOTE — Progress Notes (Signed)
   12/09/23 1345  [facility] Triage Screening (Walk-ins at [facility] only)  How Did You Hear About Us ? Self  What Is the Reason for Your Visit/Call Today? Pt is a 62 yo male who presented voluntarily and unaccomapnied stating, I'm an addict and I wanna stop. Pt stated that he uses crack cocaine and cannabis daily and has for many years. Pt stated that the crack is hurting is already unstable health and he stated he needs cannabis so he can sleep. Hx of MDD and GAD. Pt denied any psychiatric hospitalizations, any current suicidal thoughts or any suicide attempts at any time. Pt stated that there have been days I've wondered why I'm here but he stated I wouldn't fo anything about it. Pt denied HI, NSSH and AVH (outside of drug use). Pt stated that he feels paranoid about the police. 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 Pt denied any current pending charges. Pt reported using about 1.5 gm of crack cocaine and 1 bowl of cannabis daily. Pt stated that he has not used any cocaine in about 1 week and no cannabis in 2 days.  How Long Has This Been Causing You Problems? > than 6 months  Have You Recently Had Any Thoughts About Hurting Yourself? No  Are You Planning to Commit Suicide/Harm Yourself At This time? No  Have you Recently Had Thoughts About Hurting Someone Sherral? No  Are You Planning To Harm Someone At This Time? No  Physical Abuse Denies  Verbal Abuse Yes, past (Comment)  Sexual Abuse Denies  Exploitation of patient/patient's resources Denies  Self-Neglect Denies  Possible abuse reported to:  (na)  Are you currently experiencing any auditory, visual or other hallucinations? No  Have You Used Any Alcohol or Drugs in the Past 24 Hours? No  Do you have any current medical co-morbidities that require immediate attention? No (none requiring immediate attention)  Clinician description of patient physical appearance/behavior: calm, cooperative,  alert, fully oriented, slow moving and talking, good eye contact. casually dressed and disheveled. judgment and insight seem fair.  What Do You Feel Would Help You the Most Today? Alcohol or Drug Use Treatment  If access to [facility] was not available, would you have sought care in the Emergency Department? No  Determination of Need Routine (7 days)  Options For Referral Outpatient Therapy

## 2023-12-09 NOTE — Discharge Instructions (Addendum)
 Use the attached resources to call for inpatient treatment since this is your only interest.  Get help right away if: You have thoughts about hurting yourself or others. Get help right away if you feel like you may hurt yourself or others, or have thoughts about taking your own life. Go to your nearest emergency room or: Call 911. Call the National Suicide Prevention Lifeline at 414-106-7197 or 988 in the [place] This is open 24 hours a day. If you're a Veteran: Call 988 and press 1. This is open 24 hours a day. Text the [employer] at 403-102-2159. Summary Mental health is not just the absence of mental illness. It involves understanding your emotions and behaviors, and taking steps to manage them in a healthy way. If you have symptoms of mental or emotional distress, get help from family, friends, a health care provider, or a mental health professional. Practice good mental health behaviors such as stress management skills, self-calming skills, exercise, healthy sleeping and eating, and supportive relationships. This information is not intended to replace advice given to you by your health care provider. Make sure you discuss any questions you have with your health care provider.  Education provided on the fact that if experiencing worsening of psychiatry symptoms including suicidal ideations, homicidal ideations, or having auditory/visual hallucinations, etc, to call 911, 988, come back to this location, or go to the nearest ER. Pt verbalized understanding.

## 2023-12-09 NOTE — ED Notes (Signed)
 Patient discharged by provider.

## 2023-12-09 NOTE — ED Provider Notes (Signed)
 Behavioral Health Urgent Care Medical Screening Exam  Patient Name: Mason Mendoza MRN: [id number] Date of Evaluation: 12/09/23 Chief Complaint: Cocaine and THC abuse  Diagnosis:  Final diagnoses:  Cocaine use disorder (HCC)  Delta-9-tetrahydrocannabinol (THC) dependence (HCC)   History of Present illness: Mason Mendoza is a 63 y.o. male who presents to the [facility] unaccompanied and as per triage:  Pt is a 63 yo male who presented voluntarily and unaccomapnied stating, I'm an addict and I wanna stop. Pt stated that he uses crack cocaine and cannabis daily and has for many years. Pt stated that the crack is hurting is already unstable health and he stated he needs cannabis so he can sleep. Hx of MDD and GAD. Pt denied any psychiatric hospitalizations, any current suicidal thoughts or any suicide attempts at any time. Pt stated that there have been days I've wondered why I'm here but he stated I wouldn't fo anything about it. Pt denied HI, NSSH and AVH (outside of drug use). Pt stated that he feels paranoid about the police. 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 Pt denied any current pending charges. Pt reported using about 1.5 gm of crack cocaine and 1 bowl of cannabis daily. Pt stated that he has not used any cocaine in about 1 week and no cannabis in 2 days.  Assessment: During encounter with patient, he reports that his cocaine abuse started in the 1980s, he reports that back then he was working for the [employer], was residing on their property, was using $800 worth of cocaine per day, describes currently using crack; states that it consist of baking soda plus a small amount of water heated up to make it more potent and to turn it into crack so he can smoke it.  Patient reports that he currently uses $300 worth every 3 days, and he uses his Social Security income in paprt to support his habit. He reports that he also smokes  marijuana daily, from a bong. States that he starts by smoking crack cocaine, which makes him energetic, rambles, unable to calm down, and unwind.  That he follows this up by smoking marijuana, to unwind.  He reports daily use, states that it is getting too expensive, and this is the reason why he is motivated to get rehab to stop using it.  Patient reports wanting to detox from the substances above, and education provided that there is no detox protocol for cocaine and marijuana, and he is offered intensive outpatient treatment which he declines.  He verbalizes interest in rehabilitation, and is agreeable, but asked to wait in the lobby area while the list of resources is being put together for him & his vital signs also checked. Patient went to the lobby, but left the building prior to vitals being checked, and prior to his AVS being provided to him.  During encounter, he presents with Pt with depressed mood, attention to personal hygiene and grooming is fair, eye contact is good, speech is clear & coherent. Thought contents are organized and logical, and pt currently denies SI/HI/AVH or paranoia. There is no evidence of delusional thoughts. There are no signs of overt psychosis. Denies depressive symptoms, denies sy,mptoms significant for other mental health diagnosis. Reports that he resides with his two children (20 and 21 who are supportive).  Patient was educated verbally on [facility], and also educated on [employer]. He was also educated as follows: Get help  right away if: You have thoughts about hurting yourself or others. Get help right away if you feel like you may hurt yourself or others, or have thoughts about taking your own life. Go to your nearest emergency room or: Call 911. Call the National Suicide Prevention Lifeline at 832-555-3240 or 988 in the [place] This is open 24 hours a day. If you're a Veteran: Call 988 and press 1. This is open 24 hours a day. Text the [employer] at (249) 439-1182. Summary Mental health is not just the absence of mental illness. It involves understanding your emotions and behaviors, and taking steps to manage them in a healthy way. If you have symptoms of mental or emotional distress, get help from family, friends, a health care provider, or a mental health professional. Practice good mental health behaviors such as stress management skills, self-calming skills, exercise, healthy sleeping and eating, and supportive relationships. This information is not intended to replace advice given to you by your health care provider. Make sure you discuss any questions you have with your health care provider.  Education provided on the fact that if experiencing worsening of psychiatry symptoms including suicidal ideations, homicidal ideations, or having auditory/visual hallucinations, etc, to call 911, 988, come back to this location, or go to the nearest ER. Pt verbalized understanding.   Suicide Risk assessment: Minimal: No identifiable suicidal ideation.  Patients presenting with no risk factors but with morbid ruminations; may be classified as minimal risk based on the severity of the depressive symptoms. Denies any intent or plan to harm self.  Recommendations: Discharged with resources for outpatient management.  Flowsheet Row ED from 12/09/2023 in [facility]  C-SSRS RISK CATEGORY No Risk   Psychiatric Specialty Exam  Presentation  General Appearance:Fairly Groomed  Eye Contact:Fair  Speech:Clear and Coherent  Speech Volume:Normal  Handedness:Right   Mood and Affect  Mood:Depressed; Anxious  Affect:Congruent   Thought Process  Thought Processes:Coherent  Descriptions of Associations:Intact  Orientation:Full (Time, Place and Person)  Thought Content:Logical    Hallucinations:None  Ideas of Reference:None  Suicidal Thoughts:No  Homicidal Thoughts:No   Sensorium  Memory:Immediate  Fair  Judgment:Fair  Insight:Fair   Executive Functions  Concentration:Fair  Attention Span:Fair  Recall:Fair  Fund of Knowledge:Fair  Language:Fair   Psychomotor Activity  Psychomotor Activity:Normal   Assets  Assets:Resilience   Sleep  Sleep:Fair  Number of hours: No data recorded  Physical Exam: Physical Exam Nursing note reviewed.  Neurological:     General: No focal deficit present.     Mental Status: He is oriented to person, place, and time.    Review of Systems  Psychiatric/Behavioral:  Positive for depression and substance abuse. Negative for hallucinations, memory loss and suicidal ideas. The patient is nervous/anxious and has insomnia.   All other systems reviewed and are negative.  There were no vitals taken for this visit. There is no height or weight on file to calculate BMI.  Musculoskeletal: Strength & Muscle Tone: decreased-Ambulating with a cane. Gait & Station: unsteady Patient leans: N/A   [facility] MSE Discharge Disposition for Follow up and Recommendations: Based on my evaluation the patient does not appear to have an emergency medical condition and can be discharged with resources and follow up care in outpatient services for Substance Abuse Intensive Outpatient Program  Outpatient resources as well as inpatient substance abuse treatment resources provided. Donia Snell, NP 12/09/2023, 4:02 PM

## 2023-12-09 NOTE — Progress Notes (Signed)
   12/09/23 1421  [place] Suicide Severity Rating Scale  1. In the past month -  Have you wished you were dead or wished you could go to sleep and not wake up? No  2. In the past month - Have you actually had any thoughts of killing yourself? No  6. Have you ever done anything, started to do anything, or prepared to do anything to end your life? No  C-SSRS RISK CATEGORY No Risk
